# Patient Record
Sex: Female | Born: 1965 | Race: Black or African American | Hispanic: No | Marital: Single | Smoking: Current every day smoker
Health system: Southern US, Community
[De-identification: ages and names within clinical notes are randomized; demographics above are authoritative.]

## PROBLEM LIST (undated history)

## (undated) HISTORY — PX: APPENDECTOMY: SHX54

---

## 2007-04-12 ENCOUNTER — Emergency Department (HOSPITAL_COMMUNITY): Admission: EM | Admit: 2007-04-12 | Discharge: 2007-04-12 | Payer: Self-pay | Admitting: Emergency Medicine

## 2009-05-28 ENCOUNTER — Emergency Department (HOSPITAL_COMMUNITY): Admission: EM | Admit: 2009-05-28 | Discharge: 2009-05-29 | Payer: Self-pay | Admitting: Emergency Medicine

## 2010-07-24 LAB — WOUND CULTURE

## 2014-03-22 ENCOUNTER — Encounter (HOSPITAL_COMMUNITY): Payer: Self-pay | Admitting: *Deleted

## 2014-03-22 ENCOUNTER — Emergency Department (HOSPITAL_COMMUNITY)
Admission: EM | Admit: 2014-03-22 | Discharge: 2014-03-22 | Disposition: A | Discharge status: Home / Self Care | Payer: Self-pay | Attending: Emergency Medicine | Admitting: Emergency Medicine

## 2014-03-22 DIAGNOSIS — Z720 Tobacco use: Secondary | ICD-10-CM | POA: Insufficient documentation

## 2014-03-22 DIAGNOSIS — M5412 Radiculopathy, cervical region: Secondary | ICD-10-CM | POA: Insufficient documentation

## 2014-03-22 MED ORDER — MELOXICAM 15 MG PO TABS: 15.0000 mg | ORAL_TABLET | Freq: Every day | ORAL | Status: AC

## 2014-03-22 MED ORDER — PREDNISONE 20 MG PO TABS: ORAL_TABLET | ORAL | Status: AC

## 2014-03-22 MED ORDER — METHOCARBAMOL 500 MG PO TABS: 500.0000 mg | ORAL_TABLET | Freq: Two times a day (BID) | ORAL | Status: AC

## 2014-03-22 NOTE — ED Notes (Signed)
Rt arm pain x 3 weeks sharp  Comes and goes  no injury pain goes to fingers feels numb at times but does not stay numb can wiggle her fingers  Has good pulse arm and hand are warm totouch aND FINGERS HAVE <,2 SEC CAP REFILL
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
## 2014-03-22 NOTE — ED Notes (Signed)
Pt is here with 3 weeks of pain to right arm and radiates down to hands.. Pt states it wakes her up out of her sleep.  Pt states sometimes no feeling in it.

## 2014-03-22 NOTE — Progress Notes (Signed)
[facility] Community Health & Eligibility Specialist ° °Spoke to patient regarding primary care resources and the [facility] orange card. Orange card application and instructions provided. Resource guide and my contact information also given for any future questions or concerns. No other Community Health & Eligibility Specialist needs identified at this time. °

## 2014-03-22 NOTE — Discharge Instructions (Signed)
1. Medications: robaxin, prednisone, mobic, usual home medications 2. Treatment: rest, drink plenty of fluids, gentle stretching as discussed, alternate ice and heat 3. Follow Up: Please followup with your primary doctor in 3 days for discussion of your diagnoses and further evaluation after today's visit; if you do not have a primary care doctor use the resource guide provided to find one;  Return to the ER for worsening pain, difficulty walking, loss of bowel or bladder control or other concerning symptoms.     Cervical Radiculopathy Cervical radiculopathy happens when a nerve in the neck is pinched or bruised by a slipped (herniated) disk or by arthritic changes in the bones of the cervical spine. This can occur due to an injury or as part of the normal aging process. Pressure on the cervical nerves can cause pain or numbness that runs from your neck all the way down into your arm and fingers. CAUSES  There are many possible causes, including:  Injury.  Muscle tightness in the neck from overuse.  Swollen, painful joints (arthritis).  Breakdown or degeneration in the bones and joints of the spine (spondylosis) due to aging.  Bone spurs that may develop near the cervical nerves. SYMPTOMS  Symptoms include pain, weakness, or numbness in the affected arm and hand. Pain can be severe or irritating. Symptoms may be worse when extending or turning the neck. DIAGNOSIS  Your caregiver will ask about your symptoms and do a physical exam. He or she may test your strength and reflexes. X-rays, CT scans, and MRI scans may be needed in cases of injury or if the symptoms do not go away after a period of time. Electromyography (EMG) or nerve conduction testing may be done to study how your nerves and muscles are working. TREATMENT  Your caregiver may recommend certain exercises to help relieve your symptoms. Cervical radiculopathy can, and often does, get better with time and treatment. If your problems  continue, treatment options may include:  Wearing a soft collar for short periods of time.  Physical therapy to strengthen the neck muscles.  Medicines, such as nonsteroidal anti-inflammatory drugs (NSAIDs), oral corticosteroids, or spinal injections.  Surgery. Different types of surgery may be done depending on the cause of your problems. HOME CARE INSTRUCTIONS   Put ice on the affected area.  Put ice in a plastic bag.  Place a towel between your skin and the bag.  Leave the ice on for 15-20 minutes, 03-04 times a day or as directed by your caregiver.  If ice does not help, you can try using heat. Take a warm shower or bath, or use a hot water bottle as directed by your caregiver.  You may try a gentle neck and shoulder massage.  Use a flat pillow when you sleep.  Only take over-the-counter or prescription medicines for pain, discomfort, or fever as directed by your caregiver.  If physical therapy was prescribed, follow your caregiver's directions.  If a soft collar was prescribed, use it as directed. SEEK IMMEDIATE MEDICAL CARE IF:   Your pain gets much worse and cannot be controlled with medicines.  You have weakness or numbness in your hand, arm, face, or leg.  You have a high fever or a stiff, rigid neck.  You lose bowel or bladder control (incontinence).  You have trouble with walking, balance, or speaking. MAKE SURE YOU:   Understand these instructions.  Will watch your condition.  Will get help right away if you are not doing well or get  worse. Document Released: 01/16/2001 Document Revised: 07/16/2011 Document Reviewed: 12/05/2010 [facility]Exit[facility] Patient Information 2015 [place], [place]. This information is not intended to replace advice given to you by your health care provider. Make sure you discuss any questions you have with your health care provider.

## 2014-03-23 ENCOUNTER — Encounter (HOSPITAL_BASED_OUTPATIENT_CLINIC_OR_DEPARTMENT_OTHER): Payer: Self-pay | Admitting: Emergency Medicine

## 2014-11-03 ENCOUNTER — Emergency Department (HOSPITAL_COMMUNITY)
Admission: EM | Admit: 2014-11-03 | Discharge: 2014-11-03 | Disposition: A | Discharge status: Home / Self Care | Payer: No Typology Code available for payment source | Attending: Emergency Medicine | Admitting: Emergency Medicine

## 2014-11-03 ENCOUNTER — Encounter (HOSPITAL_COMMUNITY): Payer: Self-pay

## 2014-11-03 ENCOUNTER — Emergency Department (HOSPITAL_COMMUNITY): Payer: No Typology Code available for payment source

## 2014-11-03 DIAGNOSIS — S41112A Laceration without foreign body of left upper arm, initial encounter: Secondary | ICD-10-CM | POA: Diagnosis not present

## 2014-11-03 DIAGNOSIS — Z23 Encounter for immunization: Secondary | ICD-10-CM | POA: Diagnosis not present

## 2014-11-03 DIAGNOSIS — Z791 Long term (current) use of non-steroidal anti-inflammatories (NSAID): Secondary | ICD-10-CM | POA: Diagnosis not present

## 2014-11-03 DIAGNOSIS — R451 Restlessness and agitation: Secondary | ICD-10-CM | POA: Diagnosis not present

## 2014-11-03 DIAGNOSIS — Z79899 Other long term (current) drug therapy: Secondary | ICD-10-CM | POA: Diagnosis not present

## 2014-11-03 DIAGNOSIS — Y9289 Other specified places as the place of occurrence of the external cause: Secondary | ICD-10-CM | POA: Diagnosis not present

## 2014-11-03 DIAGNOSIS — Y998 Other external cause status: Secondary | ICD-10-CM | POA: Insufficient documentation

## 2014-11-03 DIAGNOSIS — Y9389 Activity, other specified: Secondary | ICD-10-CM | POA: Insufficient documentation

## 2014-11-03 DIAGNOSIS — Z720 Tobacco use: Secondary | ICD-10-CM | POA: Diagnosis not present

## 2014-11-03 DIAGNOSIS — IMO0002 Reserved for concepts with insufficient information to code with codable children: Secondary | ICD-10-CM

## 2014-11-03 MED ORDER — TETANUS-DIPHTH-ACELL PERTUSSIS 5-2.5-18.5 LF-MCG/0.5 IM SUSP
0.5000 mL | Freq: Once | INTRAMUSCULAR | Status: AC
  Administered 2014-11-03: 0.5 mL via INTRAMUSCULAR
  Filled 2014-11-03: qty 0.5

## 2014-11-03 MED ORDER — LIDOCAINE-EPINEPHRINE (PF) 2 %-1:200000 IJ SOLN
20.0000 mL | Freq: Once | INTRAMUSCULAR | Status: AC
  Administered 2014-11-03: 20 mL

## 2014-11-03 MED ORDER — LIDOCAINE-EPINEPHRINE (PF) 2 %-1:200000 IJ SOLN
INTRAMUSCULAR | Status: AC
  Filled 2014-11-03: qty 20

## 2014-11-03 MED ORDER — TRAMADOL HCL 50 MG PO TABS: 50.0000 mg | ORAL_TABLET | Freq: Four times a day (QID) | ORAL | Status: AC | PRN

## 2014-11-03 MED ORDER — HYDROCODONE-ACETAMINOPHEN 5-325 MG PO TABS
1.0000 | ORAL_TABLET | Freq: Once | ORAL | Status: AC
  Administered 2014-11-03: 1 via ORAL
  Filled 2014-11-03: qty 1
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
## 2014-11-03 NOTE — ED Notes (Signed)
Patient transported to X-ray 
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
## 2014-11-03 NOTE — Discharge Instructions (Signed)
Please follow up in 7-10 days for staple removal. Please take pain medication and/or muscle relaxants as prescribed and as needed for pain. Please do not drive on narcotic pain medication or on muscle relaxants. Please read all discharge instructions and return precautions.   Staple Care and Removal Your caregiver has used staples today to repair your wound. Staples are used to help a wound heal faster by holding the edges of the wound together. The staples can be removed when the wound has healed well enough to stay together after the staples are removed. A dressing (wound covering), depending on the location of the wound, may have been applied. This may be changed once per day or as instructed. If the dressing sticks, it may be soaked off with soapy water or hydrogen peroxide. Only take over-the-counter or prescription medicines for pain, discomfort, or fever as directed by your caregiver.  If you did not receive a tetanus shot today because you did not recall when your last one was given, check with your caregiver when you have your staples removed to determine if one is needed. Return to your caregiver's office in 1 week or as suggested to have your staples removed. SEEK IMMEDIATE MEDICAL CARE IF:   You have redness, swelling, or increasing pain in the wound.  You have pus coming from the wound.  You have a fever.  You notice a bad smell coming from the wound or dressing.  Your wound edges break open after staples have been removed. Document Released: 01/16/2001 Document Revised: 07/16/2011 Document Reviewed: 01/31/2005 [facility]ExitCare Patient Information 2015 [place]ExitCare, [place]LL[place]. This information is not intended to replace advice given to you by your health care provider. Make sure you discuss any questions you have with your health care provider.

## 2014-11-17 ENCOUNTER — Emergency Department (HOSPITAL_COMMUNITY)
Admission: EM | Admit: 2014-11-17 | Discharge: 2014-11-17 | Disposition: A | Discharge status: Home / Self Care | Payer: No Typology Code available for payment source | Attending: Emergency Medicine | Admitting: Emergency Medicine

## 2014-11-17 ENCOUNTER — Encounter (HOSPITAL_COMMUNITY): Payer: Self-pay | Admitting: Emergency Medicine

## 2014-11-17 DIAGNOSIS — Z791 Long term (current) use of non-steroidal anti-inflammatories (NSAID): Secondary | ICD-10-CM | POA: Diagnosis not present

## 2014-11-17 DIAGNOSIS — Z720 Tobacco use: Secondary | ICD-10-CM | POA: Diagnosis not present

## 2014-11-17 DIAGNOSIS — Z4802 Encounter for removal of sutures: Secondary | ICD-10-CM | POA: Diagnosis not present

## 2014-11-17 DIAGNOSIS — Z79899 Other long term (current) drug therapy: Secondary | ICD-10-CM | POA: Diagnosis not present

## 2014-11-17 NOTE — Discharge Instructions (Signed)
Suture Removal, Care After °Refer to this sheet in the next few weeks. These instructions provide you with information on caring for yourself after your procedure. Your health care provider may also give you more specific instructions. Your treatment has been planned according to current medical practices, but problems sometimes occur. Call your health care provider if you have any problems or questions after your procedure. °WHAT TO EXPECT AFTER THE PROCEDURE °After your stitches (sutures) are removed, it is typical to have the following: °· Some discomfort and swelling in the wound area. °· Slight redness in the area. °HOME CARE INSTRUCTIONS  °· If you have skin adhesive strips over the wound area, do not take the strips off. They will fall off on their own in a few days. If the strips remain in place after 14 days, you may remove them. °· Change any bandages (dressings) at least once a day or as directed by your health care provider. If the bandage sticks, soak it off with warm, soapy water. °· Apply cream or ointment only as directed by your health care provider. If using cream or ointment, wash the area with soap and water 2 times a day to remove all the cream or ointment. Rinse off the soap and pat the area dry with a clean towel. °· Keep the wound area dry and clean. If the bandage becomes wet or dirty, or if it develops a bad smell, change it as soon as possible. °· Continue to protect the wound from injury. °· Use sunscreen when out in the sun. New scars become sunburned easily. °SEEK MEDICAL CARE IF: °· You have increasing redness, swelling, or pain in the wound. °· You see pus coming from the wound. °· You have a fever. °· You notice a bad smell coming from the wound or dressing. °· Your wound breaks open (edges not staying together). °Document Released: 01/16/2001 Document Revised: 02/11/2013 Document Reviewed: 12/03/2012 °Exit[employer]® Patient Information ©2015 [employer]. This information is not  intended to replace advice given to you by your health care provider. Make sure you discuss any questions you have with your health care provider. ° °Scar Minimization °You will have a scar anytime you have surgery and a cut is made in the skin or you have something removed from your skin (mole, skin cancer, cyst). Although scars are unavoidable following surgery, there are ways to minimize their appearance. °It is important to follow all the instructions you receive from your caregiver about wound care. How your wound heals will influence the appearance of your scar. If you do not follow the wound care instructions as directed, complications such as infection may occur. Wound instructions include keeping the wound clean, moist, and not letting the wound form a scab. Some people form scars that are raised and lumpy (hypertrophic) or larger than the initial wound (keloidal). °HOME CARE INSTRUCTIONS  °· Follow wound care instructions as directed. °· Keep the wound clean by washing it with soap and water. °· Keep the wound moist with provided antibiotic cream or petroleum jelly until completely healed. Moisten twice a day for about 2 weeks. °· Get stitches (sutures) taken out at the scheduled time. °· Avoid touching or manipulating your wound unless needed. Wash your hands thoroughly before and after touching your wound. °· Follow all restrictions such as limits on exercise or work. This depends on where your scar is located. °· Keep the scar protected from sunburn. Cover the scar with sunscreen/sunblock with SPF 30 or higher. °·   Gently massage the scar using a circular motion to help minimize the appearance of the scar. Do this only after the wound has closed and all the sutures have been removed. °· For hypertrophic or keloidal scars, there are several ways to treat and minimize their appearance. Methods include compression therapy, intralesional corticosteroids, laser therapy, or surgery. These methods are performed  by your caregiver. °Remember that the scar may appear lighter or darker than your normal skin color. This difference in color should even out with time. °SEEK MEDICAL CARE IF:  °· You have a fever. °· You develop signs of infection such as pain, redness, pus, and warmth. °· You have questions or concerns. °Document Released: 10/11/2009 Document Revised: 07/16/2011 Document Reviewed: 10/11/2009 °ExitCare® Patient Information ©2015 [employer]. This information is not intended to replace advice given to you by your health care provider. Make sure you discuss any questions you have with your health care provider. ° °
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
## 2014-11-17 NOTE — ED Notes (Signed)
Pt here for staple removal from under L arm/axilla area.  States area is a little sore but denies any other complaints.

## 2020-06-27 ENCOUNTER — Other Ambulatory Visit: Payer: Self-pay

## 2020-06-27 ENCOUNTER — Encounter (HOSPITAL_COMMUNITY): Payer: Self-pay

## 2020-06-27 ENCOUNTER — Ambulatory Visit (HOSPITAL_COMMUNITY)
Admission: RE | Admit: 2020-06-27 | Discharge: 2020-06-27 | Disposition: A | Discharge status: Home / Self Care | Payer: No Typology Code available for payment source | Source: Ambulatory Visit | Attending: Emergency Medicine | Admitting: Emergency Medicine

## 2020-06-27 VITALS — BP 107/77 | HR 69 | Temp 98.1°F | Resp 16

## 2020-06-27 DIAGNOSIS — M79605 Pain in left leg: Secondary | ICD-10-CM

## 2020-06-27 MED ORDER — IBUPROFEN 600 MG PO TABS: 600.0000 mg | ORAL_TABLET | Freq: Four times a day (QID) | ORAL | 0 refills | Status: AC | PRN

## 2020-06-27 NOTE — Discharge Instructions (Signed)
Take the ibuprofen as prescribed.    Follow up with an orthopedist if your symptoms are not improving.      

## 2020-06-27 NOTE — ED Triage Notes (Signed)
Pt reports Lt leg tingling for 2 months with out injury.

## 2020-06-27 NOTE — ED Provider Notes (Signed)
[facility]    CSN: [id number] Arrival date & time: 06/27/20  1157      History   Chief Complaint Chief Complaint  Patient presents with  . Leg Pain    HPI Jacqueline Williamson is a 55 y.o. female.   Patient presents with pain in her left posterior thigh and left calf intermittently x2 months.  No pain currently.  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  She denies redness or swelling in her calf.  She denies numbness, weakness, lesions, rash, back pain, or other symptoms.  Treatment attempted at home with ibuprofen which provides moderate temporary relief.  She denies pertinent medical history.  The history is provided by the patient and medical records.    History reviewed. No pertinent past medical history.  There are no problems to display for this patient.   Past Surgical History:  Procedure Laterality Date  . APPENDECTOMY      OB History   No obstetric history on file.      Home Medications    Prior to Admission medications   Medication Sig Start Date End Date Taking? Authorizing Provider  ibuprofen (ADVIL) 600 MG tablet Take 1 tablet (600 mg total) by mouth every 6 (six) hours as needed. 06/27/20  Yes Mickie Bail, NP  meloxicam (MOBIC) 15 MG tablet Take 1 tablet (15 mg total) by mouth daily. 03/22/14   Muthersbaugh, Dahlia Client, PA-C  methocarbamol (ROBAXIN) 500 MG tablet Take 1 tablet (500 mg total) by mouth 2 (two) times daily. 03/22/14   Muthersbaugh, Dahlia Client, PA-C  predniSONE (DELTASONE) 20 MG tablet 3 tabs po daily x 3 days, then 2 tabs x 3 days, then 1.5 tabs x 3 days, then 1 tab x 3 days, then 0.5 tabs x 3 days 03/22/14   Muthersbaugh, Dahlia Client, PA-C  traMADol (ULTRAM) 50 MG tablet Take 1 tablet (50 mg total) by mouth every 6 (six) hours as needed. 11/03/14   Piepenbrink, Victorino Dike, PA-C    Family History History reviewed. No pertinent family history.  Social History Social History    Tobacco Use  . Smoking status: Current Every Day Smoker  . Smokeless tobacco: Never Used  Substance Use Topics  . Alcohol use: Yes    Comment: occ  . Drug use: No     Allergies   Patient has no known allergies.   Review of Systems Review of Systems  Constitutional: Negative for chills and fever.  HENT: Negative for ear pain and sore throat.   Eyes: Negative for pain and visual disturbance.  Respiratory: Negative for cough and shortness of breath.   Cardiovascular: Negative for chest pain, palpitations and leg swelling.  Gastrointestinal: Negative for abdominal pain and vomiting.  Genitourinary: Negative for dysuria and hematuria.  Musculoskeletal: Positive for myalgias. Negative for arthralgias and back pain.  Skin: Negative for color change and rash.  Neurological: Negative for syncope, weakness and numbness.  All other systems reviewed and are negative.    Physical Exam Triage Vital Signs ED Triage Vitals  Enc Vitals Group     BP 06/27/20 1238 107/77     Pulse Rate 06/27/20 1238 69     Resp 06/27/20 1238 16     Temp 06/27/20 1238 98.1 F (36.7 C)     Temp Source 06/27/20 1238 Oral     SpO2 06/27/20 1238 100 %     Weight --  Height --      Head Circumference --      Peak Flow --      Pain Score 06/27/20 1235 9     Pain Loc --      Pain Edu? --      Excl. in GC? --    No data found.  Updated Vital Signs BP 107/77 (BP Location: Right Arm)   Pulse 69   Temp 98.1 F (36.7 C) (Oral)   Resp 16   SpO2 100%   Visual Acuity Right Eye Distance:   Left Eye Distance:   Bilateral Distance:    Right Eye Near:   Left Eye Near:    Bilateral Near:     Physical Exam Vitals and nursing note reviewed.  Constitutional:      General: She is not in acute distress.    Appearance: She is well-developed and well-nourished.  HENT:     Head: Normocephalic and atraumatic.     Mouth/Throat:     Mouth: Mucous membranes are moist.  Eyes:     Conjunctiva/sclera:  Conjunctivae normal.  Cardiovascular:     Rate and Rhythm: Normal rate and regular rhythm.     Heart sounds: Normal heart sounds.  Pulmonary:     Effort: Pulmonary effort is normal. No respiratory distress.     Breath sounds: Normal breath sounds.  Abdominal:     Palpations: Abdomen is soft.     Tenderness: There is no abdominal tenderness.  Musculoskeletal:        General: No swelling, tenderness or edema. Normal range of motion.     Cervical back: Neck supple.     Comments: Left thigh and calf nontender to palpation.  No redness or swelling.  No wounds or rash.  FROM, strength 5/5, sensation intact, 2+ pulses.    Skin:    General: Skin is warm and dry.     Capillary Refill: Capillary refill takes less than 2 seconds.     Findings: No bruising, erythema, lesion or rash.  Neurological:     General: No focal deficit present.     Mental Status: She is alert and oriented to person, place, and time.     Sensory: No sensory deficit.     Motor: No weakness.     Gait: Gait abnormal.     Comments: Limping gait.  Psychiatric:        Mood and Affect: Mood and affect and mood normal.        Behavior: Behavior normal.      UC Treatments / Results  Labs (all labs ordered are listed, but only abnormal results are displayed) Labs Reviewed - No data to display  EKG   Radiology No results found.  Procedures Procedures (including critical care time)  Medications Ordered in UC Medications - No data to display  Initial Impression / Assessment and Plan / UC Course  I have reviewed the triage vital signs and the nursing notes.  Pertinent labs & imaging results that were available during my care of the patient were reviewed by me and considered in my medical decision making (see chart for details).   Left leg pain.  No indication of DVT.  Patient's thigh and calf are nontender to palpation; no redness or swelling.  She denies current pain; She states she only has leg pain when she is  at work.  Treating with ibuprofen.  Instructed her to follow-up with her PCP or an orthopedist if her symptoms are not  improving.  Work note provided at patient request.  She agrees to plan of care.   Final Clinical Impressions(s) / UC Diagnoses   Final diagnoses:  Left leg pain     Discharge Instructions     Take the ibuprofen as prescribed.    Follow up with an orthopedist if your symptoms are not improving.       ED Prescriptions    Medication Sig Dispense Auth. Provider   ibuprofen (ADVIL) 600 MG tablet Take 1 tablet (600 mg total) by mouth every 6 (six) hours as needed. 30 tablet Mickie Bail, NP     PDMP not reviewed this encounter.   Mickie Bail, NP 06/27/20 1326

## 2021-02-02 ENCOUNTER — Emergency Department (HOSPITAL_COMMUNITY)
Admission: EM | Admit: 2021-02-02 | Discharge: 2021-02-02 | Disposition: A | Discharge status: Home / Self Care | Payer: Self-pay | Attending: Emergency Medicine | Admitting: Emergency Medicine

## 2021-02-02 ENCOUNTER — Emergency Department (HOSPITAL_COMMUNITY): Payer: Self-pay

## 2021-02-02 ENCOUNTER — Encounter (HOSPITAL_COMMUNITY): Payer: Self-pay

## 2021-02-02 ENCOUNTER — Emergency Department (HOSPITAL_BASED_OUTPATIENT_CLINIC_OR_DEPARTMENT_OTHER)
Admit: 2021-02-02 | Discharge: 2021-02-02 | Disposition: A | Discharge status: Home / Self Care | Payer: Self-pay | Attending: Emergency Medicine | Admitting: Emergency Medicine

## 2021-02-02 ENCOUNTER — Other Ambulatory Visit: Payer: Self-pay

## 2021-02-02 DIAGNOSIS — W1830XA Fall on same level, unspecified, initial encounter: Secondary | ICD-10-CM | POA: Insufficient documentation

## 2021-02-02 DIAGNOSIS — F17210 Nicotine dependence, cigarettes, uncomplicated: Secondary | ICD-10-CM | POA: Insufficient documentation

## 2021-02-02 DIAGNOSIS — W19XXXA Unspecified fall, initial encounter: Secondary | ICD-10-CM

## 2021-02-02 DIAGNOSIS — M79605 Pain in left leg: Secondary | ICD-10-CM | POA: Insufficient documentation

## 2021-02-02 DIAGNOSIS — M1990 Unspecified osteoarthritis, unspecified site: Secondary | ICD-10-CM | POA: Insufficient documentation

## 2021-02-02 NOTE — ED Triage Notes (Addendum)
Patient reports that she fell a month ago and had been having pain in the calf area of her left leg.  Patient states she now has pain from the left hip to the left calf area. Patient denies any swelling.  Patient states no pain until she walks or works all day.

## 2021-02-02 NOTE — ED Provider Notes (Signed)
[facility]-EMERGENCY DEPT Provider Note   CSN: [id number] Arrival date & time: 02/02/21  1439     History Chief Complaint  Patient presents with   Leg Pain   Fall    Jacqueline Williamson is a 55 y.o. female who presents to the ED today with complaint of gradual onset, constant, achy, left thigh pain that began about 1 month ago status post injury.  Patient states she slipped and fell and landed onto her left leg and has been having some pain since that time.  She states she went to an urgent care they told her she likely pulled a muscle.  She states that she had tried ibuprofen once a week however has mostly been dealing with the pain.  She states that the pain started in her calf however is now in her posterior thigh area prompting return ED visit today.  She states whenever she is on her feet for prolonged period of time at work this is when the pain mostly hits.  She denies any weakness, numbness, leg swelling, fevers, chills, any other associated symptoms.  No history of DVT or PE   The history is provided by the patient and medical records.      History reviewed. No pertinent past medical history.  There are no problems to display for this patient.   Past Surgical History:  Procedure Laterality Date   APPENDECTOMY       OB History   No obstetric history on file.     Family History  Problem Relation Age of Onset   Hypertension Mother    Diabetes Mother     Social History   Tobacco Use   Smoking status: Every Day    Packs/day: 0.15    Types: Cigarettes   Smokeless tobacco: Never  Vaping Use   Vaping Use: Never used  Substance Use Topics   Alcohol use: Yes    Comment: occ   Drug use: No    Home Medications Prior to Admission medications   Medication Sig Start Date End Date Taking? Authorizing Provider  ibuprofen (ADVIL) 600 MG tablet Take 1 tablet (600 mg total) by mouth every 6 (six) hours as needed. 06/27/20   Mickie Bail, NP   meloxicam (MOBIC) 15 MG tablet Take 1 tablet (15 mg total) by mouth daily. 03/22/14   Muthersbaugh, Dahlia Client, PA-C  methocarbamol (ROBAXIN) 500 MG tablet Take 1 tablet (500 mg total) by mouth 2 (two) times daily. 03/22/14   Muthersbaugh, Dahlia Client, PA-C  predniSONE (DELTASONE) 20 MG tablet 3 tabs po daily x 3 days, then 2 tabs x 3 days, then 1.5 tabs x 3 days, then 1 tab x 3 days, then 0.5 tabs x 3 days 03/22/14   Muthersbaugh, Dahlia Client, PA-C  traMADol (ULTRAM) 50 MG tablet Take 1 tablet (50 mg total) by mouth every 6 (six) hours as needed. 11/03/14   Piepenbrink, Victorino Dike, PA-C    Allergies    Patient has no known allergies.  Review of Systems   Review of Systems  Constitutional:  Negative for chills and fever.  Cardiovascular:  Negative for leg swelling.  Musculoskeletal:  Positive for arthralgias.  All other systems reviewed and are negative.  Physical Exam Updated Vital Signs BP 112/80 (BP Location: Left Arm)   Pulse 81   Temp 98.2 F (36.8 C) (Oral)   Resp 18   Ht 5\' 2"  (1.575 m)   Wt 54.4 kg   SpO2 98%   BMI 21.95 kg/m  Physical Exam Vitals and nursing note reviewed.  Constitutional:      Appearance: She is not ill-appearing or diaphoretic.  HENT:     Head: Normocephalic and atraumatic.  Eyes:     Conjunctiva/sclera: Conjunctivae normal.  Cardiovascular:     Rate and Rhythm: Normal rate and regular rhythm.     Pulses: Normal pulses.  Pulmonary:     Effort: Pulmonary effort is normal.     Breath sounds: Normal breath sounds. No wheezing, rhonchi or rales.  Musculoskeletal:     Comments: No overlying skin changes to LLE. + TTP to left hip and left posterior thigh. ROM intact to hip, knee, and ankle. Strength and sensation intact throughout. 2+ DP pulse.   Skin:    General: Skin is warm and dry.     Coloration: Skin is not jaundiced.  Neurological:     Mental Status: She is alert.    ED Results / Procedures / Treatments   Labs (all labs ordered are listed, but only  abnormal results are displayed) Labs Reviewed - No data to display  EKG None  Radiology DG Hip Unilat With Pelvis 2-3 Views Left  Result Date: 02/02/2021 CLINICAL DATA:  Larey Seat 1 month ago, left hip pain EXAM: DG HIP (WITH OR WITHOUT PELVIS) 2-3V LEFT COMPARISON:  None. FINDINGS: Frontal view of the pelvis as well as frontal and frogleg lateral views of the left hip are obtained. No fracture, subluxation, or dislocation. Symmetrical bilateral hip osteoarthritis. Sacroiliac joints are normal. Mild osteitis pubis. Soft tissues are normal. IMPRESSION: 1. No acute displaced fracture.  Symmetrical hip osteoarthritis. Electronically Signed   By: Sharlet Salina M.D.   On: 02/02/2021 17:05   DG Femur Min 2 Views Left  Result Date: 02/02/2021 CLINICAL DATA:  Larey Seat 1 month ago, left hip pain EXAM: LEFT FEMUR 2 VIEWS COMPARISON:  None. FINDINGS: Frontal and lateral views of the left femur are obtained. No fracture, subluxation, or dislocation. Mild left hip osteoarthritis. Soft tissues are normal. IMPRESSION: 1. Left hip osteoarthritis.  No acute fracture. Electronically Signed   By: Sharlet Salina M.D.   On: 02/02/2021 17:06   VAS [place] LOWER EXTREMITY VENOUS (DVT) (7a-7p)  Result Date: 02/02/2021  Lower Venous DVT Study Patient Name:  Jacqueline Williamson  Date of Exam:   02/02/2021 Medical Rec #: [id number]           Accession #:    [id number] Date of Birth: [birth year]           Patient Gender: F Patient Age:   32 years Exam Location:  [facility] Procedure:      VAS [place] LOWER EXTREMITY VENOUS (DVT) Referring Phys: Gunnard Dorrance --------------------------------------------------------------------------------  Indications: Pain.  Risk Factors: None identified. Comparison Study: No prior studies. Performing Technologist: Chanda Busing RVT  Examination Guidelines: A complete evaluation includes B-mode imaging, spectral Doppler, color Doppler, and power Doppler as needed of all accessible portions of each  vessel. Bilateral testing is considered an integral part of a complete examination. Limited examinations for reoccurring indications may be performed as noted. The reflux portion of the exam is performed with the patient in reverse Trendelenburg.  +-----+---------------+---------+-----------+----------+--------------+ RIGHTCompressibilityPhasicitySpontaneityPropertiesThrombus Aging +-----+---------------+---------+-----------+----------+--------------+ CFV  Full           Yes      Yes                                 +-----+---------------+---------+-----------+----------+--------------+   +---------+---------------+---------+-----------+----------+--------------+  LEFT     CompressibilityPhasicitySpontaneityPropertiesThrombus Aging +---------+---------------+---------+-----------+----------+--------------+ CFV      Full           Yes      Yes                                 +---------+---------------+---------+-----------+----------+--------------+ SFJ      Full                                                        +---------+---------------+---------+-----------+----------+--------------+ FV Prox  Full                                                        +---------+---------------+---------+-----------+----------+--------------+ FV Mid   Full                                                        +---------+---------------+---------+-----------+----------+--------------+ FV DistalFull                                                        +---------+---------------+---------+-----------+----------+--------------+ PFV      Full                                                        +---------+---------------+---------+-----------+----------+--------------+ POP      Full           Yes      Yes                                 +---------+---------------+---------+-----------+----------+--------------+ PTV      Full                                                         +---------+---------------+---------+-----------+----------+--------------+ PERO     Full                                                        +---------+---------------+---------+-----------+----------+--------------+     Summary: RIGHT: - No evidence of common femoral vein obstruction.  LEFT: - There is no evidence of deep vein thrombosis in the lower extremity.  - No cystic structure found in the popliteal fossa.  *See table(s) above for measurements and observations.  Preliminary     Procedures Procedures   Medications Ordered in ED Medications - No data to display  ED Course  I have reviewed the triage vital signs and the nursing notes.  Pertinent labs & imaging results that were available during my care of the patient were reviewed by me and considered in my medical decision making (see chart for details).    MDM Rules/Calculators/A&P                           55 year old female who presents to the ED today complaining of left leg pain for the past month status post fall.  Pain started in her calf and now is traveling up her thigh.  On arrival to the ED vitals are stable patient appears to be no acute distress.  She is noted to have some tenderness to the posterior aspect of her thigh and left hip.  We will plan for x-rays as well as DVT study.  If negative will discharge home with Ortho follow-up.  DVT study negative.  X-ray does show some osteoarthritis of the left hip which may be contributing to her pain.  Have recommended over-the-counter Tylenol as needed.  Patient given information for orthopedics for follow-up as needed.  She is in agreement with plan and stable for discharge.   This note was prepared using Dragon voice recognition software and may include unintentional dictation errors due to the inherent limitations of voice recognition software.  Final Clinical Impression(s) / ED Diagnoses Final diagnoses:  Fall, initial encounter   Left leg pain  Arthritis    Rx / DC Orders ED Discharge Orders     None        Discharge Instructions      Your xray showed that you have some arthritis in your left hip which may be contributing to your pain  I would recommend OTC Tylenol arthritis as needed for pain  Follow up with orthopedics for further eval  Return to the ED for any new/worsening symptoms       Tanda Rockers, PA-C 02/02/21 1739    Lorre Nick, MD 02/05/21 [phone]

## 2021-02-02 NOTE — Progress Notes (Signed)
Left lower extremity venous duplex has been completed. Preliminary results can be found in CV Proc through chart review.  Results were given to [facility] PA.  02/02/21 4:12 PM Olen Cordial RVT

## 2021-02-02 NOTE — Discharge Instructions (Addendum)
Your xray showed that you have some arthritis in your left hip which may be contributing to your pain  I would recommend OTC Tylenol arthritis as needed for pain  Follow up with orthopedics for further eval  Return to the ED for any new/worsening symptoms

## 2022-02-24 IMAGING — CR DG HIP (WITH OR WITHOUT PELVIS) 2-3V*L*
3 series · 3 of 3 positions shown · non-contrast
Comparison: None.

CLINICAL DATA: Fell 1 month ago, left hip pain

EXAM:
DG HIP (WITH OR WITHOUT PELVIS) 2-3V LEFT

[t pelvis ap]
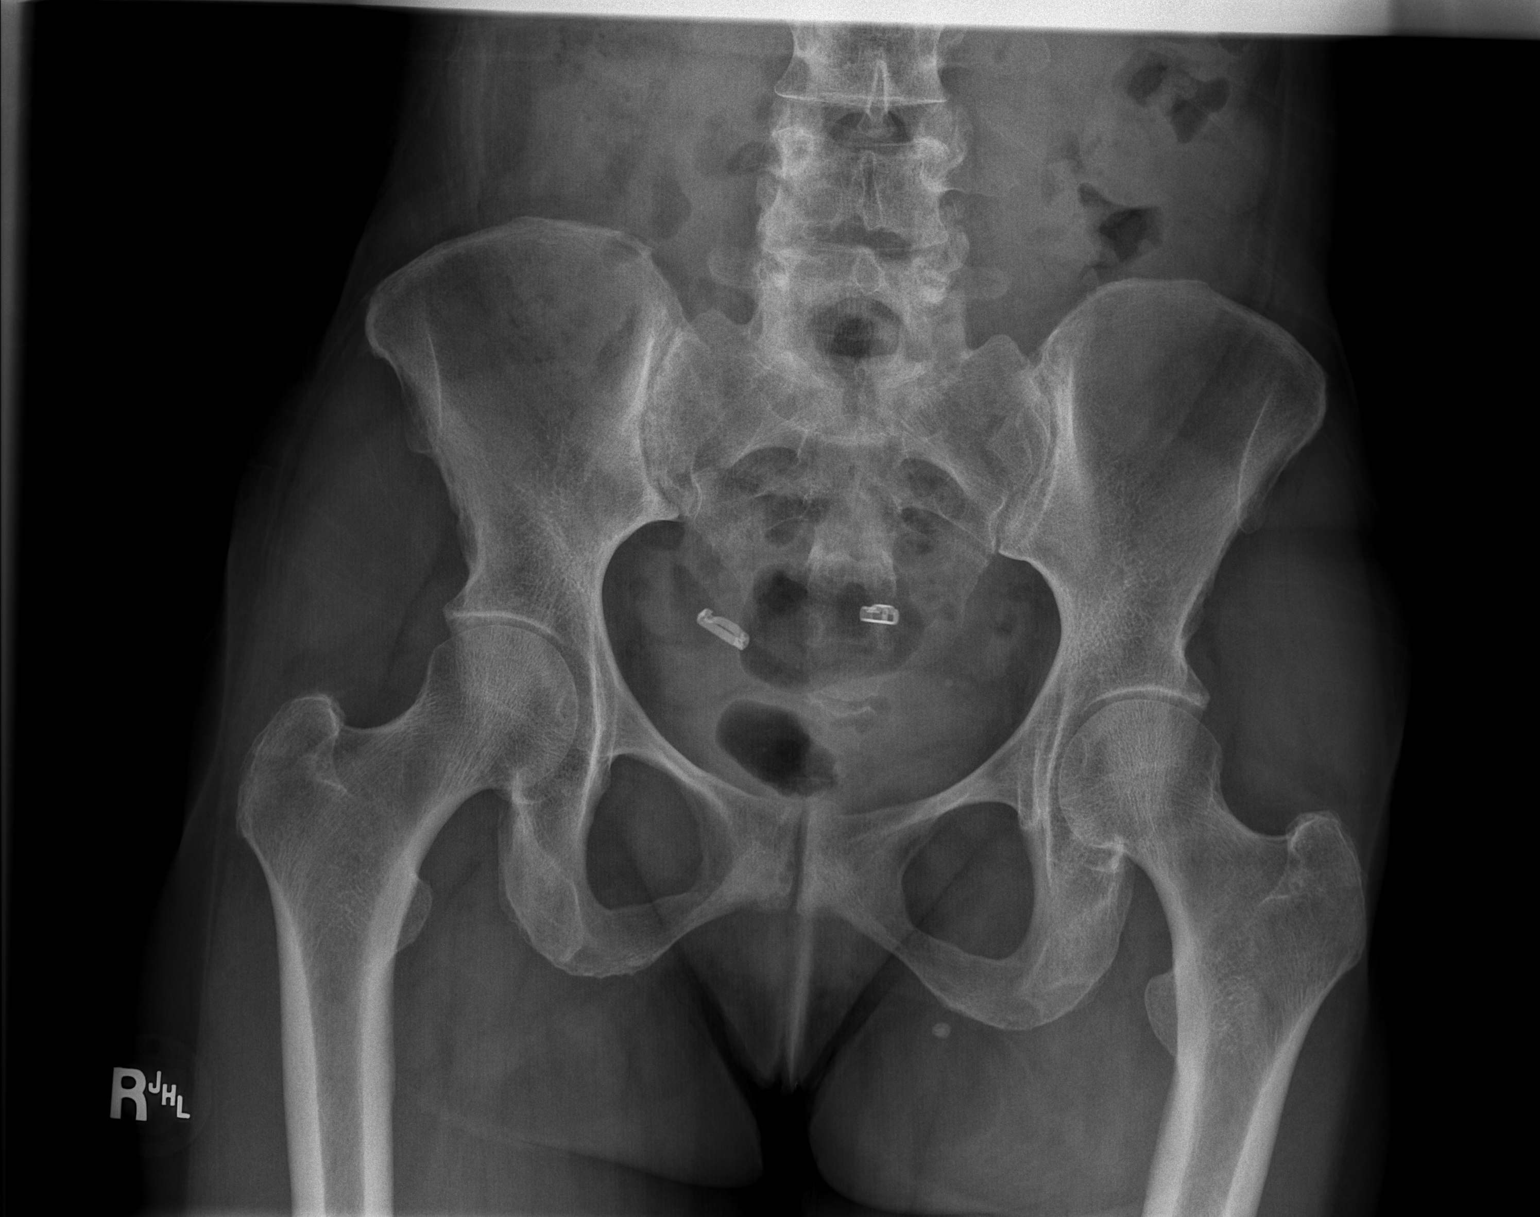

[t hip ap left]
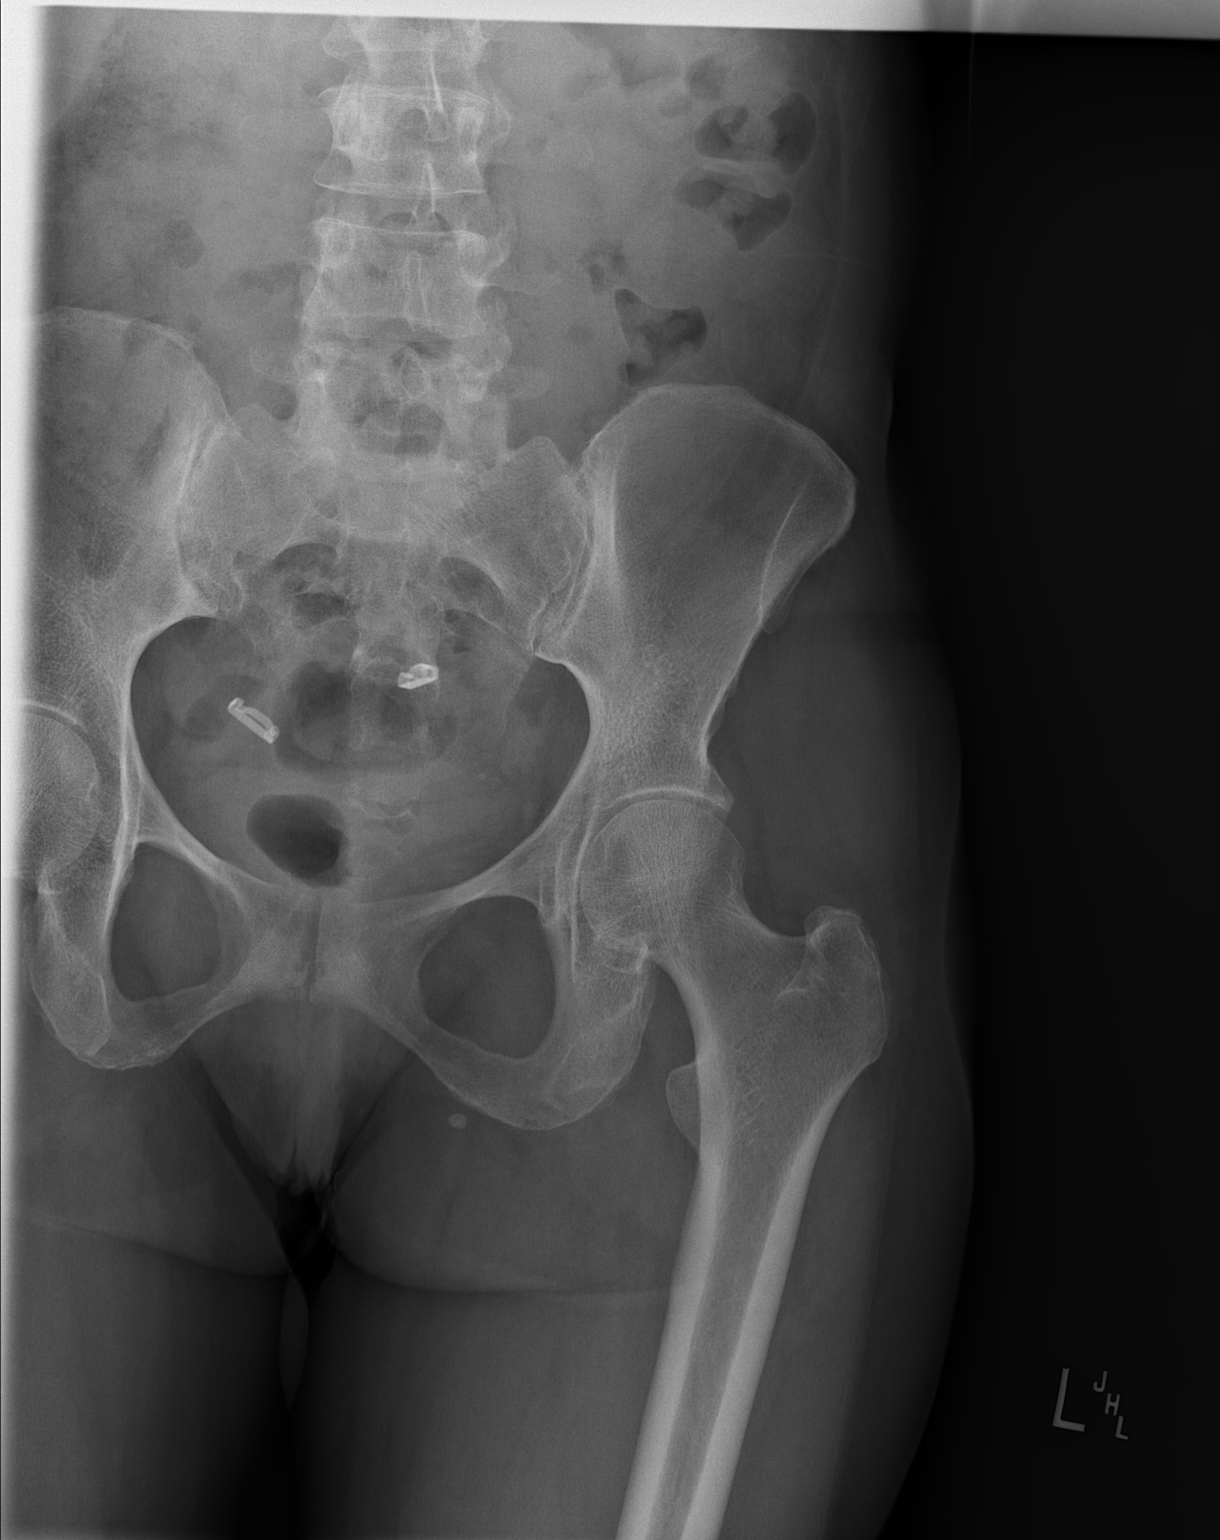

[t hip frog leg left]
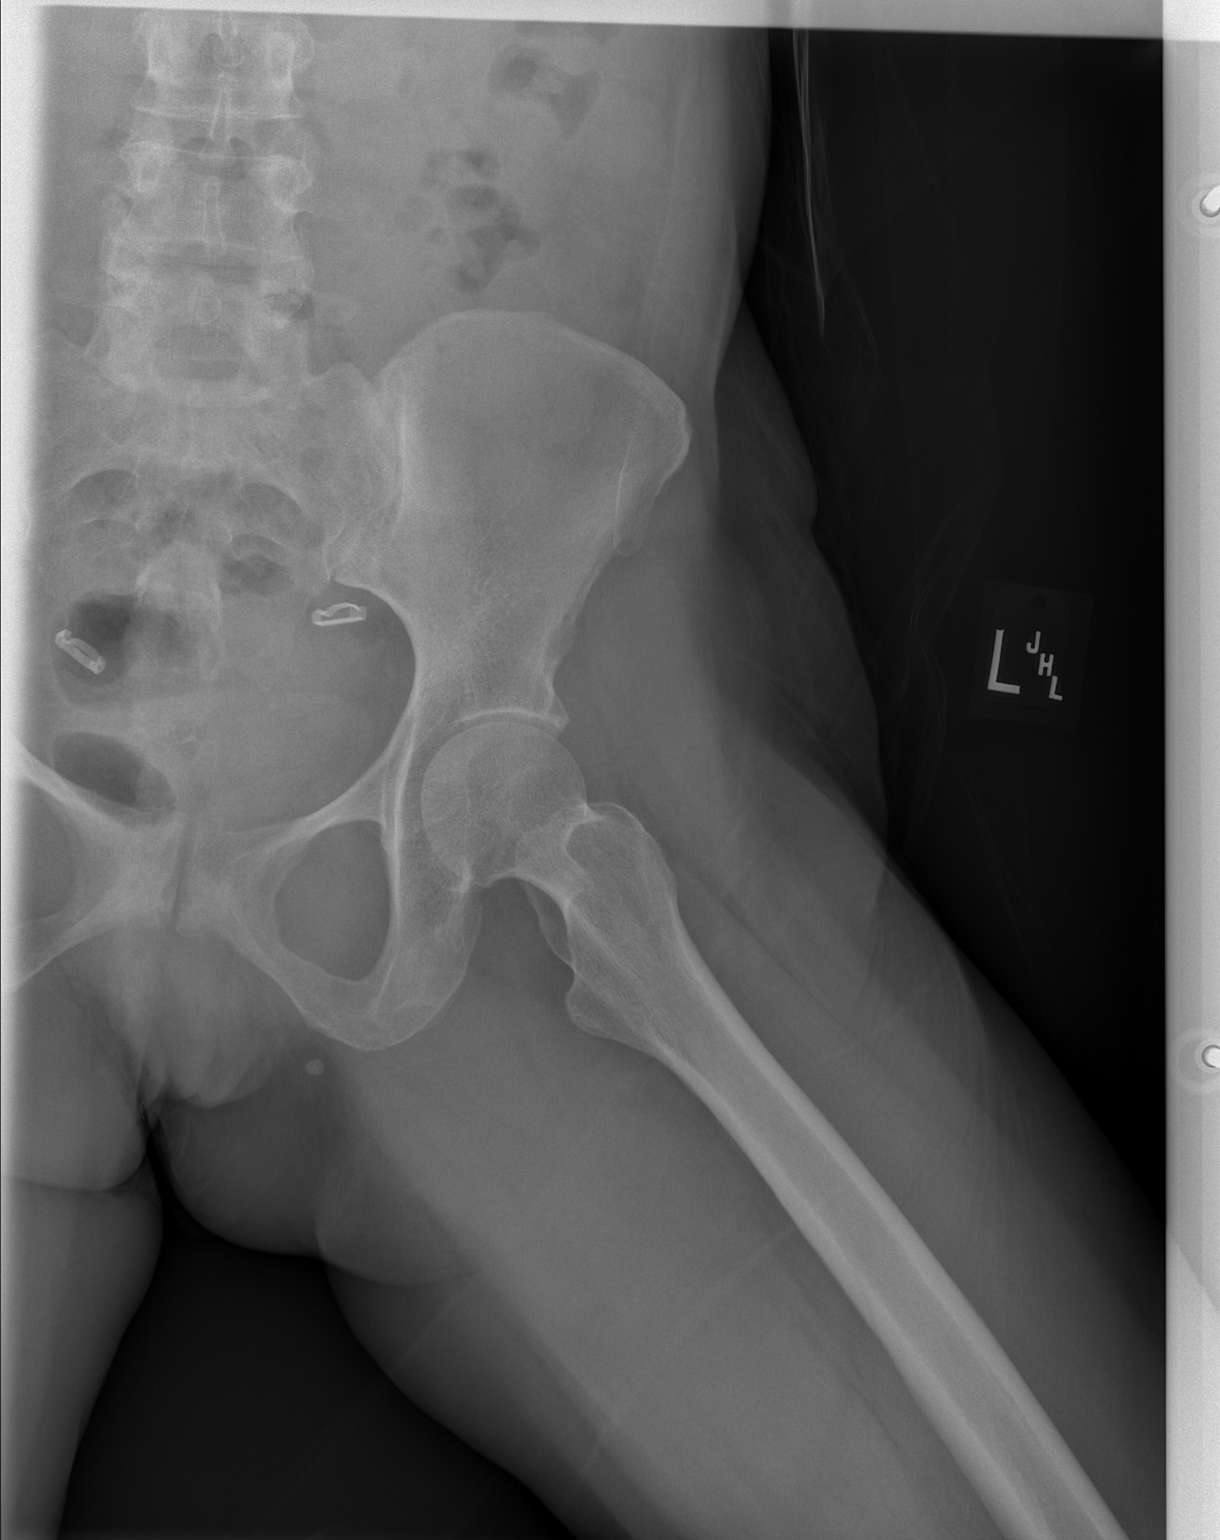

[3 of 3 positions shown; findings below may reference images not displayed]

FINDINGS: Frontal view of the pelvis as well as frontal and frogleg lateral
views of the left hip are obtained. No fracture, subluxation, or
dislocation. Symmetrical bilateral hip osteoarthritis. Sacroiliac
joints are normal. Mild osteitis pubis. Soft tissues are normal.
IMPRESSION: 1. No acute displaced fracture.  Symmetrical hip osteoarthritis.

## 2022-02-24 IMAGING — CR DG FEMUR 2+V*L*
4 series · 4 of 4 positions shown · non-contrast
Comparison: None.

CLINICAL DATA: Fell 1 month ago, left hip pain

EXAM:
LEFT FEMUR 2 VIEWS

[t hip ap left]
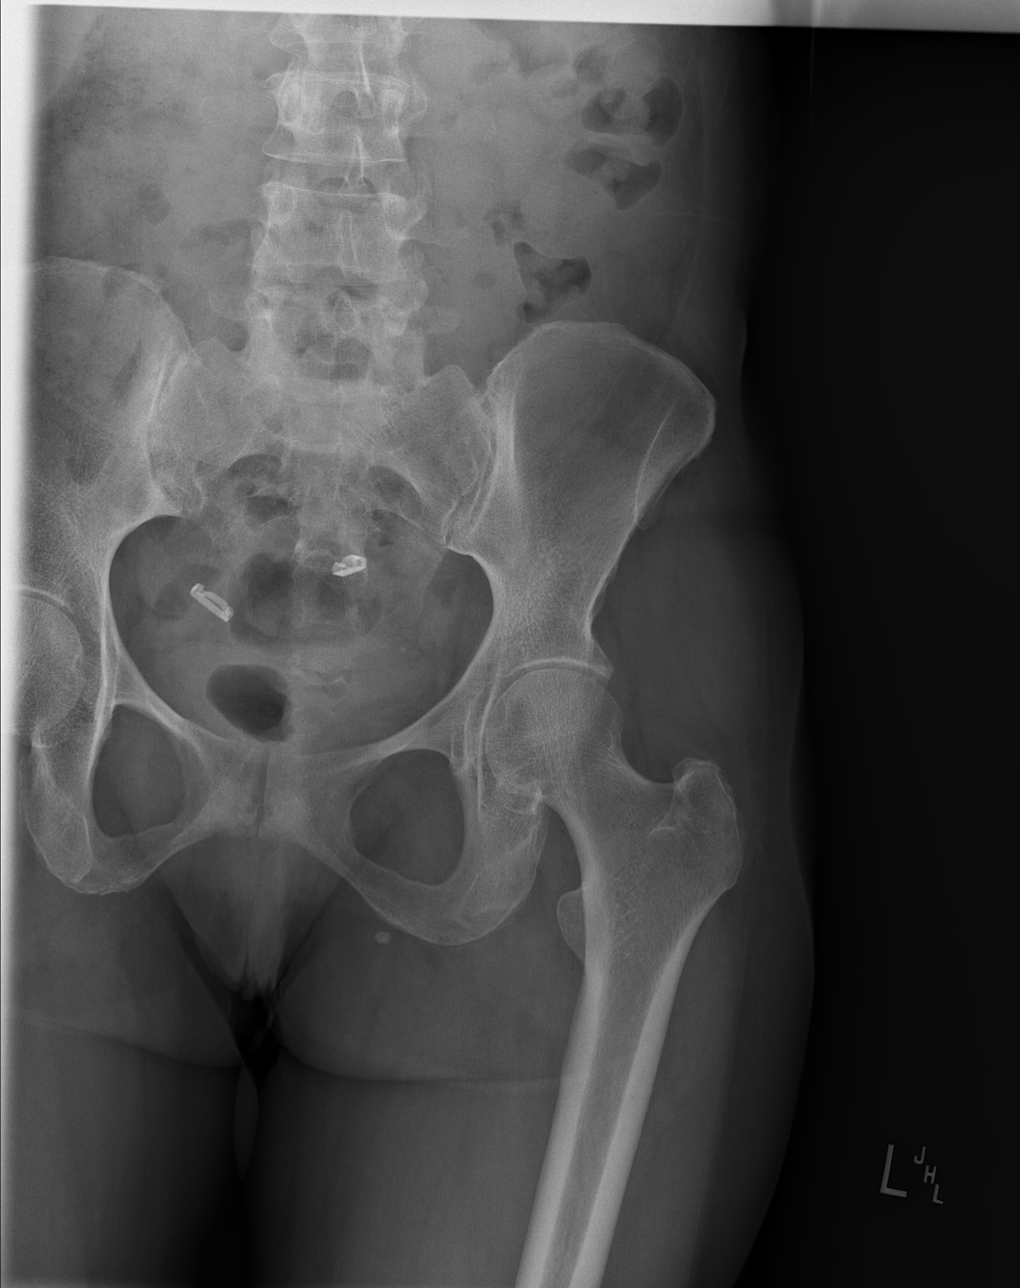

[t hip frog leg left]
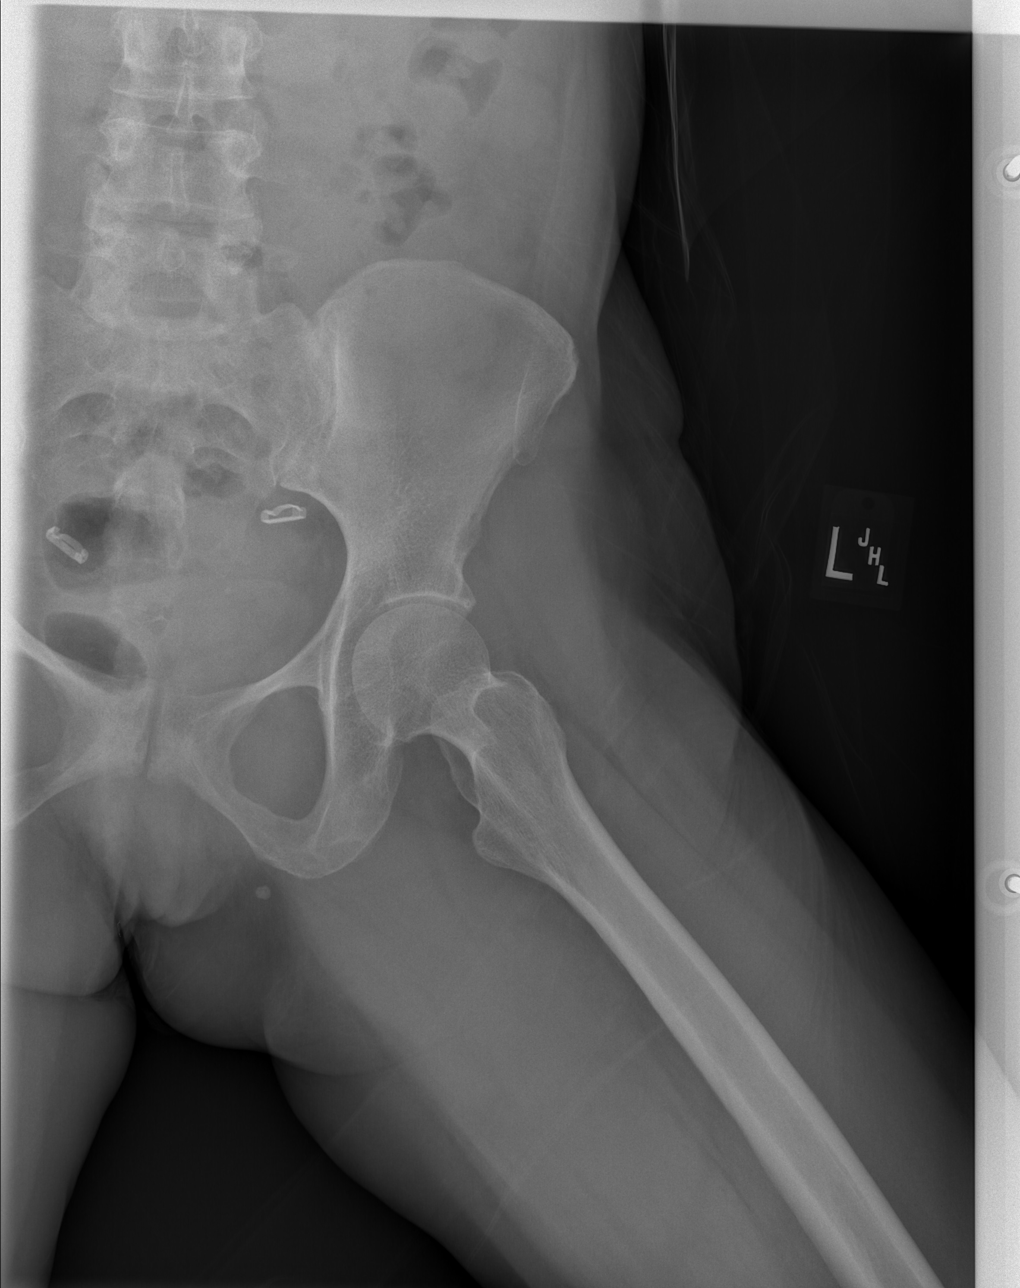

[t femur distal ap left]
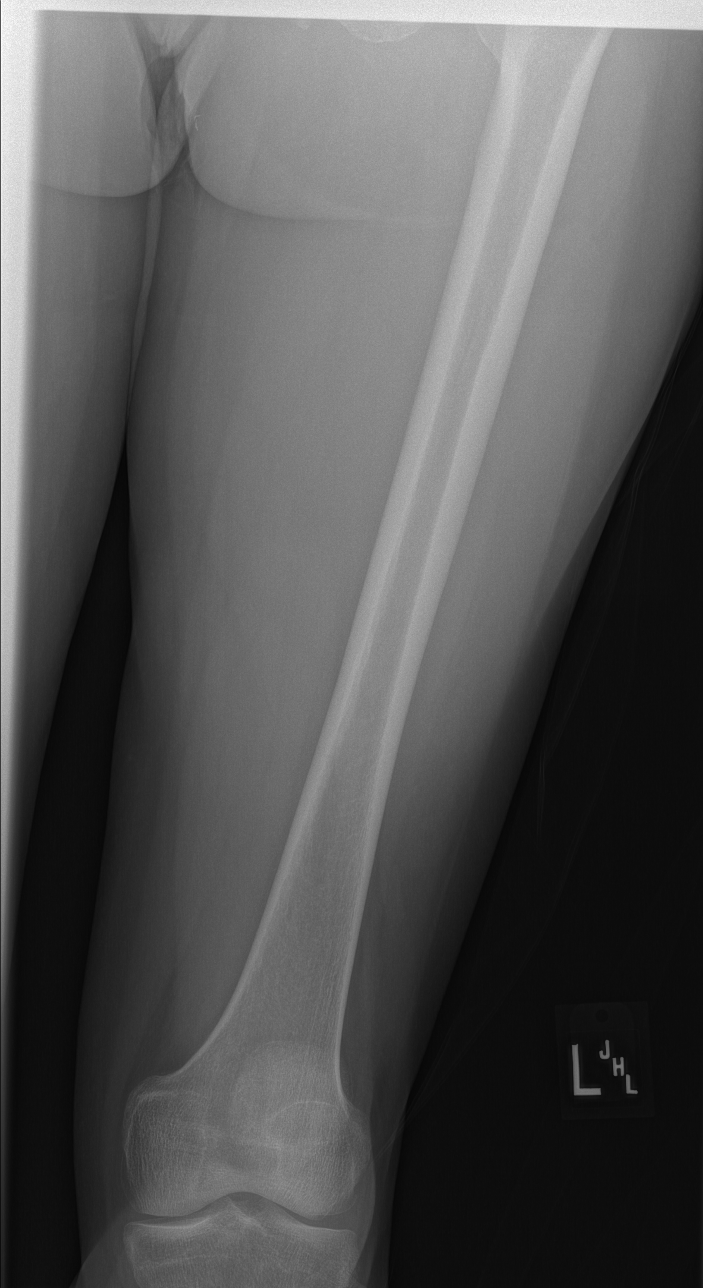

[t femur distal lat left]
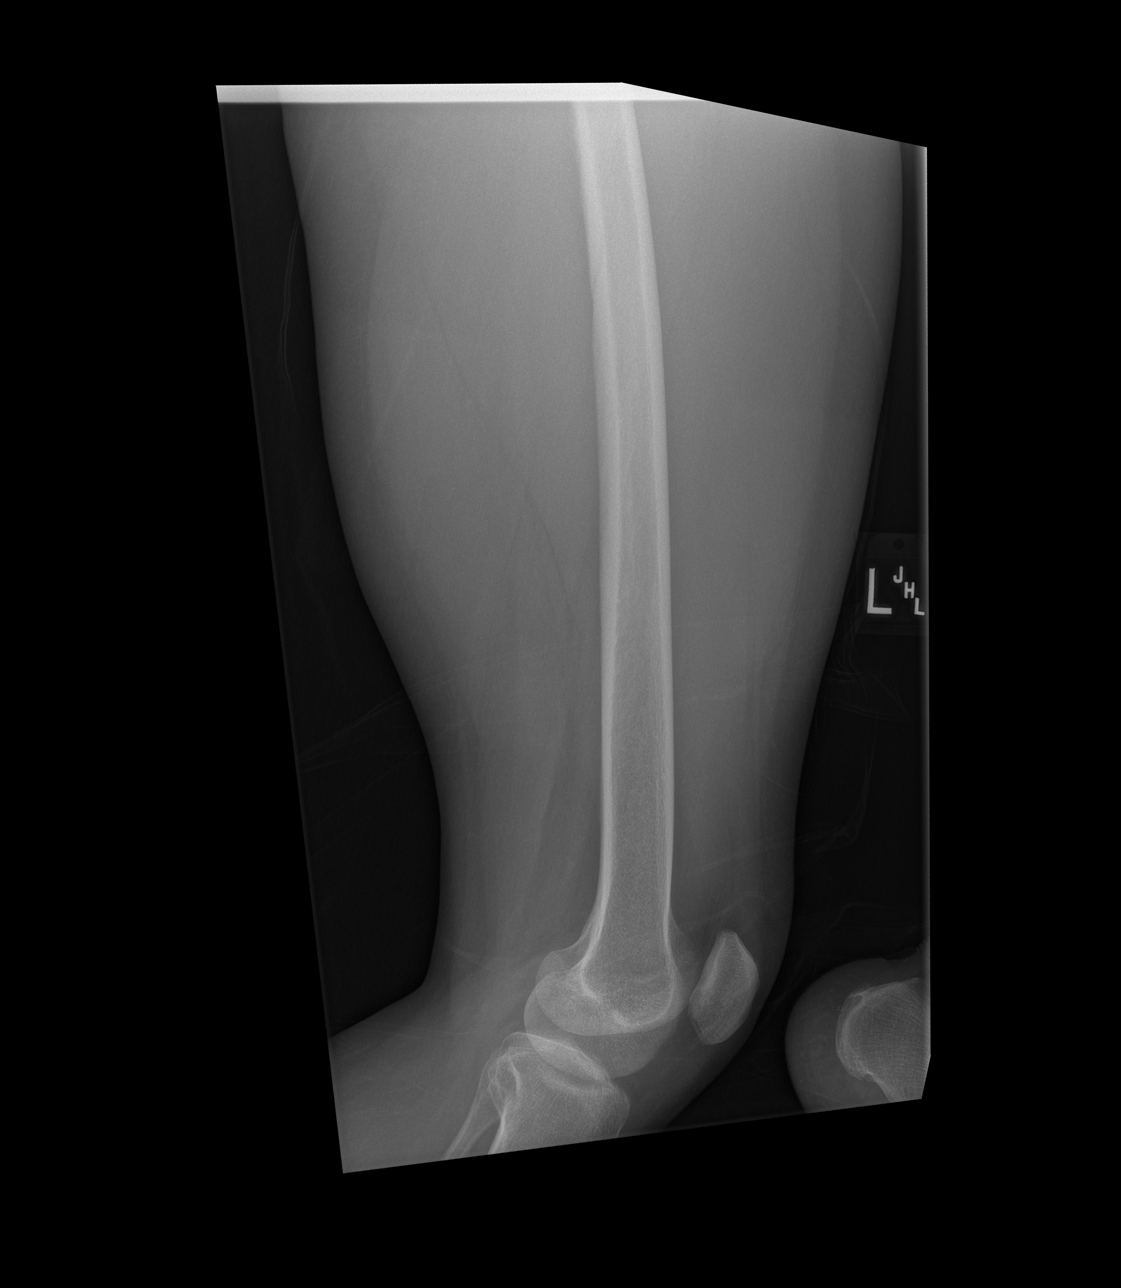

[4 of 4 positions shown; findings below may reference images not displayed]

FINDINGS: Frontal and lateral views of the left femur are obtained. No
fracture, subluxation, or dislocation. Mild left hip osteoarthritis.
Soft tissues are normal.
IMPRESSION: 1. Left hip osteoarthritis.  No acute fracture.
